# Patient Record
Sex: Male | Born: 1990 | Hispanic: No | Marital: Single | Smoking: Never smoker
Health system: Southern US, Community
[De-identification: ages and names within clinical notes are randomized; demographics above are authoritative.]

## PROBLEM LIST (undated history)

## (undated) DIAGNOSIS — G919 Hydrocephalus, unspecified: Secondary | ICD-10-CM

## (undated) HISTORY — PX: OTHER SURGICAL HISTORY: SHX169

---
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
## 2011-09-12 ENCOUNTER — Inpatient Hospital Stay (INDEPENDENT_AMBULATORY_CARE_PROVIDER_SITE_OTHER)
Admission: RE | Admit: 2011-09-12 | Discharge: 2011-09-12 | Disposition: A | Discharge status: Home / Self Care | Payer: Managed Care, Other (non HMO) | Source: Ambulatory Visit | Attending: Family Medicine | Admitting: Family Medicine

## 2011-09-12 DIAGNOSIS — L02619 Cutaneous abscess of unspecified foot: Secondary | ICD-10-CM

## 2015-12-05 ENCOUNTER — Encounter (HOSPITAL_COMMUNITY): Payer: Self-pay | Admitting: Emergency Medicine

## 2015-12-05 ENCOUNTER — Ambulatory Visit (INDEPENDENT_AMBULATORY_CARE_PROVIDER_SITE_OTHER): Payer: BLUE CROSS/BLUE SHIELD | Admitting: Family Medicine

## 2015-12-05 ENCOUNTER — Emergency Department (HOSPITAL_COMMUNITY)
Admission: EM | Admit: 2015-12-05 | Discharge: 2015-12-05 | Discharge status: Against Medical Advice | Payer: BLUE CROSS/BLUE SHIELD | Attending: Emergency Medicine | Admitting: Emergency Medicine

## 2015-12-05 VITALS — BP 110/62 | HR 63 | Temp 98.9°F | Resp 18 | Ht 68.5 in | Wt 141.2 lb

## 2015-12-05 DIAGNOSIS — R1084 Generalized abdominal pain: Secondary | ICD-10-CM | POA: Diagnosis present

## 2015-12-05 DIAGNOSIS — R1110 Vomiting, unspecified: Secondary | ICD-10-CM | POA: Insufficient documentation

## 2015-12-05 DIAGNOSIS — R1031 Right lower quadrant pain: Secondary | ICD-10-CM

## 2015-12-05 DIAGNOSIS — Z5321 Procedure and treatment not carried out due to patient leaving prior to being seen by health care provider: Secondary | ICD-10-CM

## 2015-12-05 HISTORY — DX: Hydrocephalus, unspecified: G91.9

## 2015-12-05 LAB — URINALYSIS, ROUTINE W REFLEX MICROSCOPIC
BILIRUBIN URINE: NEGATIVE
GLUCOSE, UA: NEGATIVE mg/dL
Hgb urine dipstick: NEGATIVE
KETONES UR: NEGATIVE mg/dL
LEUKOCYTES UA: NEGATIVE
Nitrite: NEGATIVE
PH: 7 (ref 5.0–8.0)
PROTEIN: NEGATIVE mg/dL
Specific Gravity, Urine: 1.025 (ref 1.005–1.030)

## 2015-12-05 LAB — COMPREHENSIVE METABOLIC PANEL
ALBUMIN: 3.8 g/dL (ref 3.5–5.0)
ALT: 19 U/L (ref 17–63)
AST: 28 U/L (ref 15–41)
Alkaline Phosphatase: 75 U/L (ref 38–126)
Anion gap: 11 (ref 5–15)
BILIRUBIN TOTAL: 0.5 mg/dL (ref 0.3–1.2)
BUN: 17 mg/dL (ref 6–20)
CHLORIDE: 102 mmol/L (ref 101–111)
CO2: 28 mmol/L (ref 22–32)
CREATININE: 0.83 mg/dL (ref 0.61–1.24)
Calcium: 10 mg/dL (ref 8.9–10.3)
GFR calc Af Amer: >60 mL/min (ref >60)
GLUCOSE: 116 mg/dL — AB (ref 65–99)
POTASSIUM: 4.3 mmol/L (ref 3.5–5.1)
Sodium: 141 mmol/L (ref 135–145)
TOTAL PROTEIN: 9.2 g/dL — AB (ref 6.5–8.1)

## 2015-12-05 LAB — CBC
HEMATOCRIT: 40.2 % (ref 39.0–52.0)
Hemoglobin: 13.6 g/dL (ref 13.0–17.0)
MCH: 30.6 pg (ref 26.0–34.0)
MCHC: 33.8 g/dL (ref 30.0–36.0)
MCV: 90.3 fL (ref 78.0–100.0)
PLATELETS: 290 10*3/uL (ref 150–400)
RBC: 4.45 MIL/uL (ref 4.22–5.81)
RDW: 11.8 % (ref 11.5–15.5)
WBC: 9.8 10*3/uL (ref 4.0–10.5)

## 2015-12-05 LAB — LIPASE, BLOOD: Lipase: 23 U/L (ref 11–51)

## 2015-12-05 NOTE — ED Notes (Signed)
Called pt to recheck V/S. Pt not found in lobby

## 2015-12-05 NOTE — Progress Notes (Signed)
[facility] [address] [place] [address] [phone]  Date:  12/05/2015   Name:  Phillip Mclaughlin   DOB:  06-20-1991   MRN:  [id number]  PCP:  No primary care provider on file.    Chief Complaint: Abdominal Pain and Shortness of Breath   History of Present Illness:  Phillip Mclaughlin is a 25 y.o. very pleasant male patient who presents with the following:  Here today as a new patient. Accompanied by his mother Misty Stanley who came up from near [place] to be with him.  Complaint of right lower belly pain for about one month, worse for 2 weeks He has a history of a VP shunt shunt.  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  Has not been able to run since he got sick   His NSG is Dr. Claiborne Billings at [facility]. Last saw him 2 weeks ago at which time he already had these symptoms.  He has the shunt due to hydrocephalus.  It has been in place since 2007, replaced last year.  It has been working well   He has had vomiting after eating.  He has not noted any fever.    Over the last month went to 2 different ERs near his home is [place] but we do not have these records.  Do not see anything on care everywhere.  He had labs and ?a CT but they are not sure what results may have been He saw a chiropractor this am- thought that perhaps his sx are due to MSK pain and applied physiotape to his abdomen   He has not been able to eat in a couple of days.  He is able to drink some fluids but not much.  Eating or drinking seems to cause a HA and vomiting.  No diarrhea.  He is having BMs.  Passing gas, burping or coughing causes belly pain.    There are no active problems to display for this patient.   History reviewed. No pertinent past medical history.  History reviewed. No pertinent past surgical history.  Social History  Substance Use Topics  . Smoking status: Never Smoker   . Smokeless tobacco: None  . Alcohol Use: No     History reviewed. No pertinent family history.  No Known Allergies  Medication list has been reviewed and updated.  No current outpatient prescriptions on file prior to visit.   No current facility-administered medications on file prior to visit.    Review of Systems:  As per HPI- otherwise negative. Shunt operations- no other surgeries  Physical Examination: Filed Vitals:   12/05/15 1415  BP: 110/62  Pulse: 63  Temp: 98.9 F (37.2 C)  Resp: 18   Filed Vitals:   12/05/15 1415  Height: 5' 8.5" (1.74 m)  Weight: 141 lb 3.2 oz (64.048 kg)   Body mass index is 21.15 kg/(m^2). Ideal Body Weight: Weight in (lb) to have BMI = 25: 166.5  GEN: WDWN, NAD, Non-toxic, A & O x 3, does not appear to feel well, laying down in room.  Moves slowly and painfully  HEENT: Atraumatic, Normocephalic. Neck supple. No masses, No LAD. Ears and Nose: No external deformity. CV: RRR, No M/G/R. No JVD. No thrill. No extra heart sounds. PULM: CTA B, no wheezes, crackles, rhonchi. No retractions. No resp. distress. No accessory muscle use. ABD: S, ND, +BS.No  HSM.  Diffuse tenderness and guarding of the abdomen. Most in the RUQ EXTR: No c/c/e NEURO Normal gait.  PSYCH: Normally interactive. Conversant. Not depressed or anxious appearing.  Calm demeanor.    Assessment and Plan: Right lower quadrant abdominal pain  Here today with abd pain that has been present for a month but worse for 1-2 weeks.  He has had some eval and imaging but we do not have these records.   It seems that his shunt is not to blame as pt reports this was evaluated and found to be working properly  Pt is frustrated that no one has been able to determine the cause of his illness.  He is frustrated when I recommended that he go to the ER- however explained that he needs imaging and labs that I do not have available here.  His mother will take him to the ER now. Will not charge him for today's visit  Signed Abbe Amsterdam, MD

## 2015-12-05 NOTE — ED Notes (Signed)
No answer from lobby  

## 2015-12-05 NOTE — Patient Instructions (Signed)
Please proceed to the ER at [facility] right away for further evaluation.  I hope that you will be feeling better soon- I'm sorry that I am not able to adequately take care of your here today but I think that you need imaging and labs not available at the clinic   Emergency Department - [facility] [address] [place], [place] [address]  If they are very busy you might try [facility] ER instead [facility] ? Address: [address] [place], [place] [address] Phone: [phone]

## 2015-12-05 NOTE — ED Notes (Signed)
Pt c/o severe generalized abdominal pain and vomiting x 4 weeks. Pt sts he has seen multiple doctors for the pain and no one has been able to tell him what is wrong. Pt sts the pain used to just come when he would exercise but now it's constant. Pt sts he is unable to eat and drink without vomiting. A&Ox4 and ambulatory.

## 2015-12-05 NOTE — ED Notes (Signed)
Called pt from lobby. Second attempt. No response.

## 2015-12-06 ENCOUNTER — Emergency Department (HOSPITAL_COMMUNITY): Payer: BLUE CROSS/BLUE SHIELD

## 2015-12-06 ENCOUNTER — Emergency Department (HOSPITAL_COMMUNITY)
Admission: EM | Admit: 2015-12-06 | Discharge: 2015-12-07 | Disposition: A | Discharge status: Other Acute Inpatient Hospital | Payer: BLUE CROSS/BLUE SHIELD | Attending: Emergency Medicine | Admitting: Emergency Medicine

## 2015-12-06 ENCOUNTER — Encounter (HOSPITAL_COMMUNITY): Payer: Self-pay

## 2015-12-06 DIAGNOSIS — G919 Hydrocephalus, unspecified: Secondary | ICD-10-CM | POA: Diagnosis not present

## 2015-12-06 DIAGNOSIS — R52 Pain, unspecified: Secondary | ICD-10-CM

## 2015-12-06 DIAGNOSIS — R109 Unspecified abdominal pain: Secondary | ICD-10-CM

## 2015-12-06 DIAGNOSIS — R1110 Vomiting, unspecified: Secondary | ICD-10-CM | POA: Diagnosis present

## 2015-12-06 LAB — COMPREHENSIVE METABOLIC PANEL
ALK PHOS: 73 U/L (ref 38–126)
ALT: 20 U/L (ref 17–63)
AST: 27 U/L (ref 15–41)
Albumin: 3.7 g/dL (ref 3.5–5.0)
Anion gap: 12 (ref 5–15)
BUN: 15 mg/dL (ref 6–20)
CALCIUM: 9.7 mg/dL (ref 8.9–10.3)
CO2: 27 mmol/L (ref 22–32)
CREATININE: 0.94 mg/dL (ref 0.61–1.24)
Chloride: 101 mmol/L (ref 101–111)
Glucose, Bld: 100 mg/dL — ABNORMAL HIGH (ref 65–99)
Potassium: 3.9 mmol/L (ref 3.5–5.1)
SODIUM: 140 mmol/L (ref 135–145)
TOTAL PROTEIN: 8.8 g/dL — AB (ref 6.5–8.1)
Total Bilirubin: 0.5 mg/dL (ref 0.3–1.2)

## 2015-12-06 LAB — CBC
HCT: 39 % (ref 39.0–52.0)
Hemoglobin: 13 g/dL (ref 13.0–17.0)
MCH: 30.1 pg (ref 26.0–34.0)
MCHC: 33.3 g/dL (ref 30.0–36.0)
MCV: 90.3 fL (ref 78.0–100.0)
PLATELETS: 269 10*3/uL (ref 150–400)
RBC: 4.32 MIL/uL (ref 4.22–5.81)
RDW: 11.8 % (ref 11.5–15.5)
WBC: 11 10*3/uL — AB (ref 4.0–10.5)

## 2015-12-06 LAB — RAPID URINE DRUG SCREEN, HOSP PERFORMED
AMPHETAMINES: NOT DETECTED
BENZODIAZEPINES: NOT DETECTED
Barbiturates: NOT DETECTED
Cocaine: NOT DETECTED
Opiates: NOT DETECTED
Tetrahydrocannabinol: NOT DETECTED

## 2015-12-06 LAB — URINALYSIS, ROUTINE W REFLEX MICROSCOPIC
Bilirubin Urine: NEGATIVE
Glucose, UA: NEGATIVE mg/dL
HGB URINE DIPSTICK: NEGATIVE
Ketones, ur: NEGATIVE mg/dL
LEUKOCYTES UA: NEGATIVE
Nitrite: NEGATIVE
PROTEIN: NEGATIVE mg/dL
Specific Gravity, Urine: 1.015 (ref 1.005–1.030)
pH: 6 (ref 5.0–8.0)

## 2015-12-06 LAB — LIPASE, BLOOD: Lipase: 24 U/L (ref 11–51)

## 2015-12-06 MED ORDER — ONDANSETRON HCL 4 MG/2ML IJ SOLN
4.0000 mg | Freq: Once | INTRAMUSCULAR | Status: AC
  Administered 2015-12-06: 4 mg via INTRAVENOUS
  Filled 2015-12-06: qty 2

## 2015-12-06 MED ORDER — SODIUM CHLORIDE 0.9 % IV BOLUS (SEPSIS)
1000.0000 mL | Freq: Once | INTRAVENOUS | Status: AC
  Administered 2015-12-06: 1000 mL via INTRAVENOUS

## 2015-12-06 MED ORDER — FENTANYL CITRATE (PF) 100 MCG/2ML IJ SOLN
50.0000 ug | Freq: Once | INTRAMUSCULAR | Status: AC
  Administered 2015-12-06: 50 ug via INTRAVENOUS
  Filled 2015-12-06: qty 2

## 2015-12-06 MED ORDER — IOHEXOL 300 MG/ML  SOLN
100.0000 mL | Freq: Once | INTRAMUSCULAR | Status: AC | PRN
  Administered 2015-12-06: 100 mL via INTRAVENOUS

## 2015-12-06 MED ORDER — FENTANYL CITRATE (PF) 100 MCG/2ML IJ SOLN: 50.0000 ug | INTRAMUSCULAR | Status: DC | PRN

## 2015-12-06 MED ORDER — PROCHLORPERAZINE EDISYLATE 5 MG/ML IJ SOLN
10.0000 mg | Freq: Four times a day (QID) | INTRAMUSCULAR | Status: DC | PRN
  Administered 2015-12-06: 10 mg via INTRAVENOUS
  Filled 2015-12-06: qty 2

## 2015-12-06 NOTE — ED Provider Notes (Signed)
CSN: [id number]     Arrival date & time 12/06/15  1228 History   First MD Initiated Contact with Patient 12/06/15 1648     Chief Complaint  Patient presents with  . Abdominal Pain  . Emesis     (Consider location/radiation/quality/duration/timing/severity/associated sxs/prior Treatment) HPI Phillip Mclaughlin is a 25 y.o. male history of hydrocephalus with shunt in place, presents to emergency department complaining of abdominal pain. Patient states he developed mild abdominal pain about one month ago. He states he first noticed the pain while exercising and running. He states he thought he pulled a muscle, and has been seeing chiropractor, and states it may have helped some initially. He states however the last 2 weeks pain has worsened in severity. It is now constant. It is worse with movement and eating. Patient states he is unable to keep anything down. He states he also gets severe headaches but only after eating food. He has been taking ibuprofen and Aleve with no relief. Patient denies any diarrhea, states that his last normal bowel movement today. Patient states he was in his symptoms began he was seen in emergency department in [facility]. There he had "some type of imaging test done." He states they told him it showed fluid around the shunt in his abdomen. He then followed up with his neurosurgeon who did some shunt serious and he told patient that everything looked normal. Patient then was seen by gastroenterologist, who did not do any tests, but was going to review the CT scan and was going to get back with the patient. Patient again was seen by a primary care doctor yesterday here in [place], who told him to come to emergency department for further imaging and labs. Patient denies any changes in vision, no dizziness, no neck pain, no chest pain. No urinary symptoms. Denies any drugs or alcohol. He has no other complaints. He states he was taking some over-the-counter multivitamin but  stopped.   Past Medical History  Diagnosis Date  . Hydrocephalus 2004   Past Surgical History  Procedure Laterality Date  . Hydrocephalus surgery     History reviewed. No pertinent family history. Social History  Substance Use Topics  . Smoking status: Never Smoker   . Smokeless tobacco: None  . Alcohol Use: No    Review of Systems  Constitutional: Negative for fever and chills.  Respiratory: Negative for cough, chest tightness and shortness of breath.   Cardiovascular: Negative for chest pain, palpitations and leg swelling.  Gastrointestinal: Positive for nausea, vomiting and abdominal pain. Negative for diarrhea, constipation, blood in stool, abdominal distention and anal bleeding.  Genitourinary: Negative for dysuria, urgency, frequency and hematuria.  Musculoskeletal: Negative for myalgias, arthralgias, neck pain and neck stiffness.  Skin: Negative for rash.  Allergic/Immunologic: Negative for immunocompromised state.  Neurological: Positive for headaches. Negative for dizziness, weakness, light-headedness and numbness.  All other systems reviewed and are negative.     Allergies  Review of patient's allergies indicates no known allergies.  Home Medications   Prior to Admission medications   Medication Sig Start Date End Date Taking? Authorizing Provider  aspirin-acetaminophen-caffeine (EXCEDRIN MIGRAINE) [phone] MG tablet Take 2 tablets by mouth every 6 (six) hours as needed for headache.   Yes Historical Provider, MD  naproxen sodium (ANAPROX) 220 MG tablet Take 220 mg by mouth 2 (two) times daily as needed (headache).   Yes Historical Provider, MD   BP 122/84 mmHg  Pulse 86  Temp(Src) 97.7 F (36.5 C) (Oral)  Resp 16  SpO2 100% Physical Exam  Constitutional: He appears well-developed and well-nourished. No distress.  HENT:  Head: Normocephalic and atraumatic.  Eyes: Conjunctivae are normal.  Neck: Neck supple.  Cardiovascular: Normal rate, regular  rhythm and normal heart sounds.   Pulmonary/Chest: Effort normal. No respiratory distress. He has no wheezes. He has no rales.  Abdominal: Soft. Bowel sounds are normal. He exhibits no distension. There is tenderness. There is no rebound.  Diffuse tenderness, with worse tenderness in RUQ and RLQ. Negative psoas and obturator signs. Some guarding present.   Musculoskeletal: He exhibits no edema.  Neurological: He is alert.  5/5 and equal upper and lower extremity strength bilaterally. Equal grip strength bilaterally. Normal finger to nose and heel to shin. No pronator drift.   Skin: Skin is warm and dry.  Nursing note and vitals reviewed.   ED Course  Procedures (including critical care time) Labs Review Labs Reviewed  COMPREHENSIVE METABOLIC PANEL - Abnormal; Notable for the following:    Glucose, Bld 100 (*)    Total Protein 8.8 (*)    All other components within normal limits  CBC - Abnormal; Notable for the following:    WBC 11.0 (*)    All other components within normal limits  LIPASE, BLOOD  URINALYSIS, ROUTINE W REFLEX MICROSCOPIC (NOT AT [facility])  URINE RAPID DRUG SCREEN, HOSP PERFORMED    Imaging Review Dg Skull 1-3 Views  12/06/2015  CLINICAL DATA:  Hydrocephalus, shunt EXAM: SKULL - 1-3 VIEW COMPARISON:  None FINDINGS: Intracranial shunt via RIGHT frontal approach with tip at the midline. No shunt discontinuity identified. A segment of the shunt at the lateral aspect of the RIGHT calvarium is radiolucent and integrity of the segment cannot be assessed. Osseous structures unremarkable. IMPRESSION: No definite shunt abnormalities identified. Electronically Signed   By: Ulyses Southward M.D.   On: 12/06/2015 19:40   Dg Chest 1 View  12/06/2015  CLINICAL DATA:  25 year old male with hydrocephalus and headache. Evaluation VP shunt. EXAM: CHEST 1 VIEW COMPARISON:  None. FINDINGS: The cardiomediastinal silhouette is unremarkable. There is no evidence of focal airspace disease, pulmonary  edema, suspicious pulmonary nodule/mass, pleural effusion, or pneumothorax. No acute bony abnormalities are identified. The visualized portions of the right-sided ventriculoperitoneal shunt catheter are unremarkable. IMPRESSION: No active disease. Visualized portions of the VP shunt are unremarkable. Electronically Signed   By: Harmon Pier M.D.   On: 12/06/2015 19:39   Dg Cervical Spine 2-3 Views  12/06/2015  CLINICAL DATA:  Part of a shunt series to evaluate hydrocephalus, headache for 1 day, abdominal pain vomiting and chills for 4 weeks EXAM: CERVICAL SPINE - 2-3 VIEW COMPARISON:  None. FINDINGS: Shunt tubing in the right cervical soft tissues intact. No acute soft tissue abnormalities. IMPRESSION: Single AP view cervical spine demonstrates shunt tubing Electronically Signed   By: Esperanza Heir M.D.   On: 12/06/2015 19:41   Dg Abd 1 View  12/06/2015  CLINICAL DATA:  Headache and hydrocephalus.  Evaluate VP shunt. EXAM: ABDOMEN - 1 VIEW COMPARISON:  None. FINDINGS: The visualized portions of the ventriculoperitoneal shunt are unremarkable. The bowel gas pattern is unremarkable. No dilated bowel loops are present. No bony abnormalities are present. IMPRESSION: Visualized portions of VP shunt catheter are unremarkable. Unremarkable bowel gas pattern. Electronically Signed   By: Harmon Pier M.D.   On: 12/06/2015 19:40   Ct Head Wo Contrast  12/06/2015  CLINICAL DATA:  25 year old male with vomiting and chills for 4 days. History  of shunt placement for hydrocephalus. EXAM: CT HEAD WITHOUT CONTRAST TECHNIQUE: Contiguous axial images were obtained from the base of the skull through the vertex without intravenous contrast. COMPARISON:  None. FINDINGS: Moderate to severe ventriculomegaly/hydrocephalus is identified. A right frontal ventriculostomy catheter is identified at the midline in the frontal interventricular region. Mild edema/hypodensity along the ventriculostomy catheter tract is noted. There is no  evidence of acute infarction, hemorrhage, mass lesion or midline shift. No acute bony abnormalities are identified. IMPRESSION: Moderate to severe ventriculomegaly/hydrocephalus. Correlate with any prior outside studies to assess interval change. Right frontal ventriculostomy catheter as described. Electronically Signed   By: Harmon Pier M.D.   On: 12/06/2015 18:47   Ct Abdomen Pelvis W Contrast  12/06/2015  CLINICAL DATA:  Abdominal pain for 4 weeks, vomiting, and chills for 4 days, prior shunt placement for hydrocephalus EXAM: CT ABDOMEN AND PELVIS WITH CONTRAST TECHNIQUE: Multidetector CT imaging of the abdomen and pelvis was performed using the standard protocol following bolus administration of intravenous contrast. Sagittal and coronal MPR images reconstructed from axial data set. CONTRAST:  OMNIPAQUE IOHEXOL 300 MG/ML SOLN IV. Dilute oral contrast. COMPARISON:  None FINDINGS: Lung bases clear. Contracted gallbladder. Liver, spleen, pancreas, kidneys, and adrenal glands normal. Two adjacent well-defined loculated fluid collections in the upper abdomen centered at the gastrohepatic ligament and inferior margin of the LEFT lobe of the liver, larger collection 5.6 x 3.6 x 4.3 cm and smaller collection 3.5 x 1.3 x 2.2 cm. VP shunt tubing traverses the RIGHT hemi thorax and is coiled in the RIGHT abdomen with the tip in the upper mid abdomen immediately inferior to the fluid collections. Mild mass effect upon the RIGHT lateral aspect of the stomach as well as mass effect upon the LEFT lobe of the liver. 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 No changes of acute pancreatitis are visualized, though fluid is seen within lesser sac. Large and small bowel loops grossly normal appearance. No significant additional free intraperitoneal fluid seen. Normal appearing bladder and ureters. No adenopathy, free air, or hernia. Bones unremarkable. IMPRESSION: Two adjacent low-attenuation fluid collections are  identified in the upper abdomen located at the gastrohepatic ligament and inferior margin LEFT lobe liver, which additional fluid seen extending into the lesser sac. Fluid collections are near the tip of the VP shunt and exert mass effect upon stomach and liver. 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 No definite adjacent changes of pancreatitis are identified to account for these findings but recommend correlation with serum amylase to confirm absence of pancreatitis as cause. Electronically Signed   By: Ulyses Southward M.D.   On: 12/06/2015 18:57   I have personally reviewed and evaluated these images and lab results as part of my medical decision-making.   EKG Interpretation None      MDM   Final diagnoses:  Hydrocephalus  Abdominal pain, unspecified abdominal location    Patient with history of hydrocephalus with VP shunt. Pt with worsening headache and abdominal pain. Multiple work up for this in the last 4 wks, including two trips to ED at home, his neurosurgeon, gastroenterologist. Will try pain medications, will get labs, CT of the head and abdomen and pelvis ordered.  Patient CT of the head showed moderate to severe ventriculomegaly/hydrocephalus. 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 PT's pain not improved with pain medications however, he is not wanting any more medications. He  is neurovascularly intact. VS normal. Discussed with Dr. Jordan Likes, who advised pt will need admission and possible shunt revision. I spoke with family who chose to stay with neurosurgeon who is familiar with patient and where they have done all of their testing and [facility]. I got in touch with Dr. Ann Maki and a PA with [facility] neururgery and and spine and [facility]. They will admit and accept patient for transfer.  Apparently there is a shortage of beds at [facility]. Patient will be monitored here closely  until bed is available. He will then be transferred there by transfer service. Family is aware.  11:43 PM Spoke with Dr. Ann Maki again with [facility] neurosurgery. He reviewed CTs which we were able to transfer to their pacs. He is concerned about worsening of pt's imaging. Asked for pt to be transferred to ED for him to evaluate him while bed is pending. Will transfer there.   Filed Vitals:   12/06/15 2130 12/06/15 2148 12/06/15 2200 12/06/15 2300  BP: 113/75 113/75 115/70 122/59  Pulse: 66 50 52 59  Temp:      TempSrc:      Resp:  14    SpO2: 98% 97% 97% 97%      Jaynie Crumble, PA-C 12/07/15 0051  Melene Plan, DO 12/07/15 1646

## 2015-12-06 NOTE — ED Notes (Signed)
Patient states he has had abdominal pain x 4 weeks and vomiting and chills x 4 days. Patient denies diarrhea.

## 2015-12-06 NOTE — ED Notes (Signed)
Bed: WA01 Expected date:  Expected time:  Means of arrival:  Comments: 

## 2015-12-06 NOTE — ED Notes (Signed)
Offered pt pain medication at this time, he does not want any pain meds. Updated on plan at this time

## 2015-12-06 NOTE — ED Notes (Signed)
Pts family given sandwiches and sodas.

## 2015-12-06 NOTE — Progress Notes (Signed)
Patient listed as having [employer] insurance without a pcp.  EDCM spoke to patient's mother at bedside as patient was asleep.  Patient's mother reports patient's pcp is Dr. Beola Cord on [address] in [place].  [facility] unable to locate this provider.  Patient's mother reports she has made an appointment for a new pcp here ion [place] while he goes to school with Dr. Hillard Danker in [place] on Feb 24th.  No further EDCM needs at this time.

## 2015-12-06 NOTE — ED Notes (Signed)
[phone] pt mother LISA, please called with any updates or bed placement.

## 2015-12-07 DIAGNOSIS — R1110 Vomiting, unspecified: Secondary | ICD-10-CM | POA: Diagnosis present

## 2015-12-07 DIAGNOSIS — G919 Hydrocephalus, unspecified: Secondary | ICD-10-CM | POA: Diagnosis not present

## 2015-12-19 NOTE — ED Provider Notes (Signed)
Left without being seen.   1. Patient left without being seen      Melene Plan, DO 12/19/15 [phone]

## 2016-01-06 ENCOUNTER — Ambulatory Visit: Payer: Managed Care, Other (non HMO) | Admitting: Internal Medicine

## 2016-01-10 ENCOUNTER — Telehealth: Payer: Self-pay | Admitting: Family Medicine

## 2016-01-10 NOTE — Telephone Encounter (Signed)
Molly with [facility] dropped off records for this patient. This is a Dietitian that will be seeing you. I am sending records back for you to review.

## 2017-01-22 ENCOUNTER — Emergency Department (HOSPITAL_COMMUNITY): Payer: 59

## 2017-01-22 ENCOUNTER — Emergency Department (HOSPITAL_COMMUNITY)
Admission: EM | Admit: 2017-01-22 | Discharge: 2017-01-22 | Disposition: A | Discharge status: Home / Self Care | Payer: 59 | Attending: Emergency Medicine | Admitting: Emergency Medicine

## 2017-01-22 ENCOUNTER — Encounter (HOSPITAL_COMMUNITY): Payer: Self-pay | Admitting: Emergency Medicine

## 2017-01-22 DIAGNOSIS — Y929 Unspecified place or not applicable: Secondary | ICD-10-CM | POA: Diagnosis not present

## 2017-01-22 DIAGNOSIS — Y9302 Activity, running: Secondary | ICD-10-CM | POA: Diagnosis not present

## 2017-01-22 DIAGNOSIS — S0990XA Unspecified injury of head, initial encounter: Secondary | ICD-10-CM | POA: Insufficient documentation

## 2017-01-22 DIAGNOSIS — S0181XA Laceration without foreign body of other part of head, initial encounter: Secondary | ICD-10-CM

## 2017-01-22 DIAGNOSIS — W000XXA Fall on same level due to ice and snow, initial encounter: Secondary | ICD-10-CM | POA: Insufficient documentation

## 2017-01-22 DIAGNOSIS — S01112A Laceration without foreign body of left eyelid and periocular area, initial encounter: Secondary | ICD-10-CM | POA: Diagnosis present

## 2017-01-22 DIAGNOSIS — S80212A Abrasion, left knee, initial encounter: Secondary | ICD-10-CM

## 2017-01-22 DIAGNOSIS — Y999 Unspecified external cause status: Secondary | ICD-10-CM | POA: Insufficient documentation

## 2017-01-22 MED ORDER — ACETAMINOPHEN 325 MG PO TABS
650.0000 mg | ORAL_TABLET | Freq: Once | ORAL | Status: AC
  Administered 2017-01-22: 650 mg via ORAL
  Filled 2017-01-22: qty 2

## 2017-01-22 MED ORDER — BACITRACIN ZINC 500 UNIT/GM EX OINT
1.0000 "application " | TOPICAL_OINTMENT | Freq: Once | CUTANEOUS | Status: AC
  Administered 2017-01-22: 1 via TOPICAL
  Filled 2017-01-22: qty 0.9

## 2017-01-22 MED ORDER — LIDOCAINE HCL (PF) 1 % IJ SOLN
5.0000 mL | Freq: Once | INTRAMUSCULAR | Status: AC
  Administered 2017-01-22: 5 mL via INTRADERMAL
  Filled 2017-01-22: qty 30

## 2017-01-22 MED ORDER — LIDOCAINE HCL (PF) 1 % IJ SOLN: 10.0000 mL | Freq: Once | INTRAMUSCULAR | Status: DC

## 2017-01-22 MED ORDER — SODIUM BICARBONATE 4 % IV SOLN
5.0000 mL | Freq: Once | INTRAVENOUS | Status: AC
  Administered 2017-01-22: 5 mL via SUBCUTANEOUS
  Filled 2017-01-22: qty 5

## 2017-01-22 NOTE — ED Provider Notes (Signed)
[facility]-EMERGENCY DEPT Provider Note   CSN: [id number] Arrival date & time: 01/22/17  [id number]     History   Chief Complaint Chief Complaint  Patient presents with  . Fall  . Head Injury    HPI Phillip Mclaughlin is a 26 y.o. male.  HPI  Patient presents to the emergency room for evaluation of injuries after a fall. Patient went out for a run this morning. He slipped on the ice and fell. Patient landed striking the left side of his head and his left knee. Patient was able to get up and walk after the fall. He denies any loss of consciousness but states he has not quite been feeling well since that fall. Patient denies any vomiting. He denies any numbness or weakness. He complains of a laceration of his left eyebrow and an abrasion on his left knee. He does have pain in his face. Denies any neck pain. No numbness or weakness. Patient does have a history of hydrocephalus and VP shunt. Patient states he shunts on the right side and does not think that he fell onto it. Past Medical History:  Diagnosis Date  . Hydrocephalus 2004     There are no active problems to display for this patient.   Past Surgical History:  Procedure Laterality Date  . hydrocephalus surgery         Home Medications    Prior to Admission medications   Medication Sig Start Date End Date Taking? Authorizing Provider  sulfamethoxazole-trimethoprim (BACTRIM DS,SEPTRA DS) 800-160 MG tablet Take 1 tablet by mouth 2 (two) times daily. 01/18/17 01/23/17 Yes Historical Provider, MD    Family History No family history on file.  Social History Social History  Substance Use Topics  . Smoking status: Never Smoker  . Smokeless tobacco: Not on file  . Alcohol use No     Allergies   Patient has no known allergies.   Review of Systems Review of Systems  All other systems reviewed and are negative.    Physical Exam Updated Vital Signs BP 128/86 (BP Location: Left Arm)   Pulse (!) 58   Resp 17   Ht 5\' 7"  (1.702  m)   Wt 63.5 kg   SpO2 100%   BMI 21.93 kg/m   Physical Exam  Constitutional: He appears well-developed and well-nourished. No distress.  HENT:  Head: Normocephalic.  Right Ear: External ear normal.  Left Ear: External ear normal.  Abrasion to the left face, ttp left periorbital region and nares, laceration to lateral aspect of left eyebrow   Eyes: Conjunctivae are normal. Right eye exhibits no discharge. Left eye exhibits no discharge. No scleral icterus.  Neck: Neck supple. No tracheal deviation present.  Cardiovascular: Normal rate, regular rhythm and intact distal pulses.   Pulmonary/Chest: Effort normal and breath sounds normal. No stridor. No respiratory distress. He has no wheezes. He has no rales.  Abdominal: Soft. Bowel sounds are normal. He exhibits no distension. There is no tenderness. There is no rebound and no guarding.  Musculoskeletal: He exhibits tenderness. He exhibits no edema.       Left knee: He exhibits no swelling and no deformity.  Abrasion left knee  Neurological: He is alert. He has normal strength. No cranial nerve deficit (no facial droop, extraocular movements intact, no slurred speech) or sensory deficit. He exhibits normal muscle tone. He displays no seizure activity. Coordination normal.  Skin: Skin is warm and dry. No rash noted.  Psychiatric: He has a normal mood and  affect.  Nursing note and vitals reviewed.    ED Treatments / Results    Radiology Ct Head Wo Contrast  Result Date: 01/22/2017 CLINICAL DATA:  Larey Seat while running today. Hit left side of head. Denies loss of consciousness. EXAM: CT HEAD WITHOUT CONTRAST CT MAXILLOFACIAL WITHOUT CONTRAST CT CERVICAL SPINE WITHOUT CONTRAST TECHNIQUE: Multidetector CT imaging of the head, cervical spine, and maxillofacial structures were performed using the standard protocol without intravenous contrast. Multiplanar CT image reconstructions of the cervical spine and maxillofacial structures were also  generated. COMPARISON:  CT head 12/06/2015 FINDINGS: CT HEAD FINDINGS Brain: A right frontal ventriculostomy catheter appears in stable position compared to the prior head CT 12/06/2015. The distal tip projects near the midline near the interventricular septum of the posterior frontal ventricles. There is moderate hydrocephalus. The degree of hydrocephalus has significantly decreased since the head CT of 12/06/2015. Third ventricle now measures 1.3 cm transverse diameter at its maximum, and previously measured up to 1.8 cm maximum diameter on 12/06/2015. Negative for intraparenchymal or intraventricular hemorrhage. No evidence of acute infarction. Negative for mass lesion. Shunt tubing courses along the scalp right scalp laterally and posteriorly. On the scout view, the shunt tubing appears grossly intact. Vascular: No hyperdense vessel or unexpected calcification. Skull: The skull is intact. The visualized paranasal sinuses and mastoid air cells are clear. Other: No scalp hematoma, debris, or soft tissue gas is appreciated. CT MAXILLOFACIAL FINDINGS Osseous: No fracture or mandibular dislocation. No destructive process. Orbits: Negative. No traumatic or inflammatory finding. Sinuses: Clear. Soft tissues: Negative. CT CERVICAL SPINE FINDINGS Alignment: Normal. Skull base and vertebrae: No acute fracture. No primary bone lesion or focal pathologic process. Soft tissues and spinal canal: No acute fracture. No primary bone lesion or focal pathologic process. Disc levels: Negative for a disc space narrowing. The neural foramina are are widely patent at all levels. Upper chest: The lung apices are well aerated. No evidence of pneumothorax. Other: None IMPRESSION: 1. No acute intracranial abnormality. Ventriculostomy catheter terminates in the midline near the foramen of Monro. Moderate hydrocephalus is present. Hydrocephalus is decreased/less severe now as compared to the head CT 12/06/2015. 2. Negative for acute bony  injury or significant soft tissue injury to the face. 3. Negative for acute cervical spine fracture. No significant degenerative change of the cervical spine. Electronically Signed   By: Britta Mccreedy M.D.   On: 01/22/2017 10:18   Ct Cervical Spine Wo Contrast  Result Date: 01/22/2017 CLINICAL DATA:  Larey Seat while running today. Hit left side of head. Denies loss of consciousness. EXAM: CT HEAD WITHOUT CONTRAST CT MAXILLOFACIAL WITHOUT CONTRAST CT CERVICAL SPINE WITHOUT CONTRAST TECHNIQUE: Multidetector CT imaging of the head, cervical spine, and maxillofacial structures were performed using the standard protocol without intravenous contrast. Multiplanar CT image reconstructions of the cervical spine and maxillofacial structures were also generated. COMPARISON:  CT head 12/06/2015 FINDINGS: CT HEAD FINDINGS Brain: A right frontal ventriculostomy catheter appears in stable position compared to the prior head CT 12/06/2015. The distal tip projects near the midline near the interventricular septum of the posterior frontal ventricles. There is moderate hydrocephalus. The degree of hydrocephalus has significantly decreased since the head CT of 12/06/2015. Third ventricle now measures 1.3 cm transverse diameter at its maximum, and previously measured up to 1.8 cm maximum diameter on 12/06/2015. Negative for intraparenchymal or intraventricular hemorrhage. No evidence of acute infarction. Negative for mass lesion. Shunt tubing courses along the scalp right scalp laterally and posteriorly. On the scout view, the  shunt tubing appears grossly intact. Vascular: No hyperdense vessel or unexpected calcification. Skull: The skull is intact. The visualized paranasal sinuses and mastoid air cells are clear. Other: No scalp hematoma, debris, or soft tissue gas is appreciated. CT MAXILLOFACIAL FINDINGS Osseous: No fracture or mandibular dislocation. No destructive process. Orbits: Negative. No traumatic or inflammatory finding.  Sinuses: Clear. Soft tissues: Negative. CT CERVICAL SPINE FINDINGS Alignment: Normal. Skull base and vertebrae: No acute fracture. No primary bone lesion or focal pathologic process. Soft tissues and spinal canal: No acute fracture. No primary bone lesion or focal pathologic process. Disc levels: Negative for a disc space narrowing. The neural foramina are are widely patent at all levels. Upper chest: The lung apices are well aerated. No evidence of pneumothorax. Other: None IMPRESSION: 1. No acute intracranial abnormality. Ventriculostomy catheter terminates in the midline near the foramen of Monro. Moderate hydrocephalus is present. Hydrocephalus is decreased/less severe now as compared to the head CT 12/06/2015. 2. Negative for acute bony injury or significant soft tissue injury to the face. 3. Negative for acute cervical spine fracture. No significant degenerative change of the cervical spine. Electronically Signed   By: Britta Mccreedy M.D.   On: 01/22/2017 10:18   Dg Knee Complete 4 Views Left  Result Date: 01/22/2017 CLINICAL DATA:  Slipped on icy surface this morning following on the left knee. EXAM: LEFT KNEE - COMPLETE 4+ VIEW COMPARISON:  None in PACs FINDINGS: The bones are subjectively adequately mineralized. There is no acute fracture nor dislocation. There is no joint effusion. There is beaking of the tibial spines. There are spurs arising from the superior and inferior articular margins of the patella. There is a radiodensity in the soft tissues overlying the anterior aspect of the fibular neck which may lie in bandage material. IMPRESSION: No acute fracture nor dislocation of the knee is observed. 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 Electronically Signed   By: David  [place] M.D.   On: 01/22/2017 09:43   Ct Maxillofacial Wo Cm  Result Date: 01/22/2017 CLINICAL DATA:  Larey Seat while running today. Hit left side of  head. Denies loss of consciousness. EXAM: CT HEAD WITHOUT CONTRAST CT MAXILLOFACIAL WITHOUT CONTRAST CT CERVICAL SPINE WITHOUT CONTRAST TECHNIQUE: Multidetector CT imaging of the head, cervical spine, and maxillofacial structures were performed using the standard protocol without intravenous contrast. Multiplanar CT image reconstructions of the cervical spine and maxillofacial structures were also generated. COMPARISON:  CT head 12/06/2015 FINDINGS: CT HEAD FINDINGS Brain: A right frontal ventriculostomy catheter appears in stable position compared to the prior head CT 12/06/2015. The distal tip projects near the midline near the interventricular septum of the posterior frontal ventricles. There is moderate hydrocephalus. The degree of hydrocephalus has significantly decreased since the head CT of 12/06/2015. Third ventricle now measures 1.3 cm transverse diameter at its maximum, and previously measured up to 1.8 cm maximum diameter on 12/06/2015. Negative for intraparenchymal or intraventricular hemorrhage. No evidence of acute infarction. Negative for mass lesion. Shunt tubing courses along the scalp right scalp laterally and posteriorly. On the scout view, the shunt tubing appears grossly intact. Vascular: No hyperdense vessel or unexpected calcification. Skull: The skull is intact. The visualized paranasal sinuses and mastoid air cells are clear. Other: No scalp hematoma, debris, or soft tissue gas is appreciated. CT MAXILLOFACIAL FINDINGS Osseous: No fracture or mandibular dislocation. No destructive process. Orbits: Negative. No traumatic or inflammatory finding. Sinuses: Clear. Soft tissues:  Negative. CT CERVICAL SPINE FINDINGS Alignment: Normal. Skull base and vertebrae: No acute fracture. No primary bone lesion or focal pathologic process. Soft tissues and spinal canal: No acute fracture. No primary bone lesion or focal pathologic process. Disc levels: Negative for a disc space narrowing. The neural  foramina are are widely patent at all levels. Upper chest: The lung apices are well aerated. No evidence of pneumothorax. Other: None IMPRESSION: 1. No acute intracranial abnormality. Ventriculostomy catheter terminates in the midline near the foramen of Monro. Moderate hydrocephalus is present. Hydrocephalus is decreased/less severe now as compared to the head CT 12/06/2015. 2. Negative for acute bony injury or significant soft tissue injury to the face. 3. Negative for acute cervical spine fracture. No significant degenerative change of the cervical spine. Electronically Signed   By: Britta Mccreedy M.D.   On: 01/22/2017 10:18    Procedures Procedures (including critical care time)  Medications Ordered in ED Medications  lidocaine (PF) (XYLOCAINE) 1 % injection 5 mL (not administered)  bacitracin ointment 1 application (not administered)  lidocaine (PF) (XYLOCAINE) 1 % injection 10 mL (not administered)  sodium bicarbonate (NEUT) 4 % injection 5 mL (not administered)  acetaminophen (TYLENOL) tablet 650 mg (650 mg Oral Given 01/22/17 0959)     Initial Impression / Assessment and Plan / ED Course  I have reviewed the triage vital signs and the nursing notes.  Pertinent labs & imaging results that were available during my care of the patient were reviewed by me and considered in my medical decision making (see chart for details).   x-ray imaging is reassuring. No evidence of significant injury.  Knee abrasion was irrigated. Plan on bacitracin and sterile dressing. We will plan on suture repair of the left eyebrow laceration.  PA Dahlia Client will perform suture repair  Final Clinical Impressions(s) / ED Diagnoses   Final diagnoses:  Facial laceration, initial encounter  Abrasion of left knee, initial encounter    New Prescriptions New Prescriptions   No medications on file     Linwood Dibbles, MD 01/22/17 1126

## 2017-01-22 NOTE — ED Triage Notes (Signed)
Patient was doing morning run when hit slick spot and fell. Patient hit left side of head but denies any LOC or taking blood thinners. Patient has abraisons to face and left leg. Patient has shunt on right side of head.

## 2017-01-22 NOTE — ED Notes (Signed)
Patient transported to CT 

## 2017-01-22 NOTE — Discharge Instructions (Signed)
Absorbable sutures were placed, apply antibiotic ointment to the wound, he can take Tylenol or ibuprofen as needed for aches and pains. Apply ice to help with the swelling..  Monitor for worsening headache, confusion, vomiting

## 2017-01-22 NOTE — ED Provider Notes (Signed)
..  Laceration Repair Date/Time: 01/22/2017 12:14 PM Performed by: Roxy HorsemanBROWNING, Phillip Menning Authorized by: Roxy HorsemanBROWNING, Phillip Mclaughlin   Consent:    Consent obtained:  Verbal   Consent given by:  Patient   Risks discussed:  Infection, pain and poor cosmetic result Anesthesia (see MAR for exact dosages):    Anesthesia method:  Local infiltration   Local anesthetic:  Lidocaine 1% w/o epi and sodium bicarbonate Laceration details:    Location:  Face   Face location:  L eyebrow   Length (cm):  2 Repair type:    Repair type:  Simple Pre-procedure details:    Preparation:  Patient was prepped and draped in usual sterile fashion Exploration:    Hemostasis achieved with:  Direct pressure   Wound exploration: entire depth of wound probed and visualized     Contaminated: no   Treatment:    Area cleansed with:  Betadine and saline   Amount of cleaning:  Standard   Irrigation solution:  Sterile saline Skin repair:    Repair method:  Sutures   Suture size:  5-0   Wound skin closure material used: Vicryl rapide.   Number of sutures:  3 Approximation:    Approximation:  Close   Vermilion border: well-aligned   Post-procedure details:    Patient tolerance of procedure:  Tolerated well, no immediate complications      Roxy HorsemanRobert Harnoor Reta, PA-C 01/22/17 1215

## 2017-01-31 ENCOUNTER — Encounter (HOSPITAL_COMMUNITY): Payer: Self-pay | Admitting: Emergency Medicine

## 2017-01-31 ENCOUNTER — Emergency Department (HOSPITAL_COMMUNITY)
Admission: EM | Admit: 2017-01-31 | Discharge: 2017-01-31 | Disposition: A | Discharge status: Home / Self Care | Payer: BLUE CROSS/BLUE SHIELD | Attending: Emergency Medicine | Admitting: Emergency Medicine

## 2017-01-31 DIAGNOSIS — Z4802 Encounter for removal of sutures: Secondary | ICD-10-CM | POA: Diagnosis present

## 2017-01-31 DIAGNOSIS — Z5189 Encounter for other specified aftercare: Secondary | ICD-10-CM

## 2017-01-31 NOTE — ED Triage Notes (Signed)
Patient reports having sutures placed x1 week ago to left head and here to have them removed today.

## 2017-01-31 NOTE — Discharge Instructions (Signed)
1. Medications: usual home medications 2. Treatment: rest, drink plenty of fluids, keep wound clean with warm soap and water 3. Follow Up: Please followup with your primary doctor in as needed for discussion of your diagnoses and further evaluation after today's visit; if you do not have a primary care doctor use the resource guide provided to find one; Please return to the ER for this, swelling, drainage or other signs of infection

## 2017-01-31 NOTE — ED Provider Notes (Signed)
[facility]-EMERGENCY DEPT Provider Note   CSN: [id number] Arrival date & time: 01/31/17  1250   By signing my name below, I, Clarisse Gouge, attest that this documentation has been prepared under the direction and in the presence of [employer], PA-C. Electronically Signed: Clarisse Gouge, Scribe. 01/31/17. 1:28 PM.   History   Chief Complaint Chief Complaint  Patient presents with  . Suture / Staple Removal   The history is provided by the patient and medical records. No language interpreter was used.    HPI Comments: Phillip Mclaughlin is a 26 y.o. male with Hx of Hydrocephalus who presents to the Emergency Department for a suture removal. Pt reportedly had 3 sutures placed on his left eye on 01/22/2017 by Roxy Horseman, PA-C in [facility] ED. He states he unsuccessfully attempted to remove the stiches at home and he was referred to [facility] ED for removal of the sutures at other undisclosed medical practices where he attempted to pursue treatment.  Pt denies fever, chills, redness, purulent drainage.  Pt also expresses concern because he does not have anyone to F/U with for neurosurgery related to prior shunt placement to manage hydrocephalus.  Past Medical History:  Diagnosis Date  . Hydrocephalus 2004    There are no active problems to display for this patient.   Past Surgical History:  Procedure Laterality Date  . hydrocephalus surgery         Home Medications    Prior to Admission medications   Not on File    Family History No family history on file.  Social History Social History  Substance Use Topics  . Smoking status: Never Smoker  . Smokeless tobacco: Not on file  . Alcohol use No     Allergies   Patient has no known allergies.   Review of Systems Review of Systems  Skin: Positive for wound.  All other systems reviewed and are negative.    Physical Exam Updated Vital Signs BP 107/71   Pulse 62   Temp 98.8 F (37.1 C) (Oral)   Resp 16   SpO2 99%    Physical Exam  Constitutional: He appears well-developed and well-nourished. No distress.  HENT:  Head: Normocephalic and atraumatic.  Eyes: Conjunctivae are normal.  Neck: Normal range of motion.  Cardiovascular: Normal rate and intact distal pulses.   Capillary refill < 3 sec  Pulmonary/Chest: Effort normal.  Musculoskeletal: Normal range of motion.  Neurological: He is alert.  Skin: Skin is warm and dry.  Wound well approximated and healing well Left eyebrow 3 sutures in place No erythema, induration or purulent drainage  Psychiatric: He has a normal mood and affect.     ED Treatments / Results  DIAGNOSTIC STUDIES: Oxygen Saturation is 99% on room, normal by my interpretation.    COORDINATION OF CARE: 1:24 PM Discussed treatment plan with pt at bedside and pt agreed to plan. Pt prepared for discharge. Pt advised of symptomatic and cosmetic care at home. Will refer to neurosurgery.   Procedures .Suture Removal Date/Time: 01/31/2017 1:30 PM Performed by: Mancel Bale Authorized by: Mancel Bale   Consent:    Consent obtained:  Verbal   Consent given by:  Patient   Risks discussed:  Pain and wound separation Location:    Location:  Head/neck   Head/neck location:  Eyebrow   Eyebrow location:  L eyebrow Procedure details:    Wound appearance:  No signs of infection and good wound healing   Number of sutures removed:  3 Post-procedure  details:    Post-removal:  No dressing applied   Patient tolerance of procedure:  Tolerated well, no immediate complications   (including critical care time)  Medications Ordered in ED Medications - No data to display   Initial Impression / Assessment and Plan / ED Course  I have reviewed the triage vital signs and the nursing notes.  Pertinent labs & imaging results that were available during my care of the patient were reviewed by me and considered in my medical decision making (see chart for details).     Staple  removal   Pt to ER for staple/suture removal and wound check as above. Procedure tolerated well. Vitals normal, no signs of infection. Scar minimization & return precautions given at dc.   Final Clinical Impressions(s) / ED Diagnoses   Final diagnoses:  Visit for suture removal  Visit for wound check    New Prescriptions New Prescriptions   No medications on file   I personally performed the services described in this documentation, which was scribed in my presence. The recorded information has been reviewed and is accurate.    Dahlia ClientHannah Daxen Lanum, PA-C 01/31/17 1344    Mancel BaleElliott Wentz, MD 01/31/17 1725

## 2017-04-07 ENCOUNTER — Encounter (HOSPITAL_COMMUNITY): Payer: Self-pay

## 2017-04-07 ENCOUNTER — Emergency Department (HOSPITAL_COMMUNITY)
Admission: EM | Admit: 2017-04-07 | Discharge: 2017-04-08 | Disposition: A | Discharge status: Home / Self Care | Payer: 59 | Attending: Emergency Medicine | Admitting: Emergency Medicine

## 2017-04-07 DIAGNOSIS — L989 Disorder of the skin and subcutaneous tissue, unspecified: Secondary | ICD-10-CM

## 2017-04-07 DIAGNOSIS — R21 Rash and other nonspecific skin eruption: Secondary | ICD-10-CM | POA: Diagnosis not present

## 2017-04-07 NOTE — ED Provider Notes (Signed)
[facility]-EMERGENCY DEPT Provider Note   CSN: [id number] Arrival date & time: 04/07/17  2245  By signing my name below, I, Ny'Kea Lewis, attest that this documentation has been prepared under the direction and in the presence of Graciella FreerLindsey Rithwik Schmieg, PA-C. Electronically Signed: Karren CobbleNy'Kea Lewis, ED Scribe. 04/08/17. 12:52 AM.   History   Chief Complaint Chief Complaint  Patient presents with  . bump on ear   The history is provided by the patient. No language interpreter was used.    HPI Comments: Phillip Mclaughlin is a 26 y.o. male who presents to the Emergency Department complaining of Painful bump to the posterior right ear that began this morning. Patient states he first noted the bump this morning while he was at work. He reports associated pain feels like it is gotten worse since then. He has not taken any medications for the pain. He has not tried any other treatments. Patient states that he just came to make sure that it wasn't something serious or a insect bite. He denies any fevers, hearing loss, ear pain, ear drainage.   Past Medical History:  Diagnosis Date  . Hydrocephalus 2004    There are no active problems to display for this patient.   Past Surgical History:  Procedure Laterality Date  . hydrocephalus surgery         Home Medications    Prior to Admission medications   Not on File    Family History No family history on file.  Social History Social History  Substance Use Topics  . Smoking status: Never Smoker  . Smokeless tobacco: Not on file  . Alcohol use No     Allergies   Patient has no known allergies.   Review of Systems Review of Systems  Constitutional: Negative for fever.  HENT: Negative for ear discharge, ear pain and hearing loss.   Gastrointestinal: Negative for vomiting.  All other systems reviewed and are negative.    Physical Exam Updated Vital Signs BP 123/80 (BP Location: Left Arm)   Pulse (!) 46   Temp 98.5 F (36.9 C) (Oral)    Resp 16   Ht 5\' 7"  (1.702 m)   Wt 62.6 kg (138 lb)   SpO2 100%   BMI 21.61 kg/m   Physical Exam  Constitutional: He appears well-developed and well-nourished.  Sitting comfortably on examination table  HENT:  Head: Normocephalic and atraumatic.  Right Ear: Tympanic membrane normal. No mastoid tenderness. Tympanic membrane is not erythematous.  Left Ear: Tympanic membrane normal. No mastoid tenderness. Tympanic membrane is not erythematous.  Patient has a small 0.75 cm boil/pimple to the posterior auricle of the right ear. No surrounding warmth, erythema or edema. No evidence of insect bite or open wound. No mastoid tenderness bilaterally. No pain with movement of the tragus.  Eyes: Conjunctivae and EOM are normal. Right eye exhibits no discharge. Left eye exhibits no discharge. No scleral icterus.  Pulmonary/Chest: Effort normal.  Neurological: He is alert.  Skin: Skin is warm and dry.  Psychiatric: He has a normal mood and affect. His speech is normal and behavior is normal.  Nursing note and vitals reviewed.    ED Treatments / Results  DIAGNOSTIC STUDIES: Oxygen Saturation is RA% on 100, normal by my interpretation.   COORDINATION OF CARE: 12:52 AM-Discussed next steps with pt. Pt verbalized understanding and is agreeable with the plan.   Labs (all labs ordered are listed, but only abnormal results are displayed) Labs Reviewed - No data to display  EKG  EKG Interpretation None       Radiology No results found.  Procedures Procedures (including critical care time)  Medications Ordered in ED Medications - No data to display   Initial Impression / Assessment and Plan / ED Course  I have reviewed the triage vital signs and the nursing notes.  Pertinent labs & imaging results that were available during my care of the patient were reviewed by me and considered in my medical decision making (see chart for details).     26 year old male who presents with a small  bump to the posterior right ear with associated pain. No treatments tried prior to arrival. Physical exam findings appear to be suggestive of a small boil to the posterior auricle. History/physical exam are not concerning for an acute abscess that would need a drainage in the emergency department. History/physical exam are not concerning for mastoiditis or otitis externa. No indications for I&D in the department at this time. Instructed patient to apply warm compresses to help express drainage and take NSAIDs for symptomatic relief. Strict return precautions discussed. Patient stresses understanding and agreement to plan.  Final Clinical Impressions(s) / ED Diagnoses   Final diagnoses:  Bumps on skin    New Prescriptions There are no discharge medications for this patient.  I personally performed the services described in this documentation, which was scribed in my presence. The recorded information has been reviewed and is accurate.    Maxwell Caul, PA-C 04/08/17 1610    Alvira Monday, MD 04/10/17 [phone]

## 2017-04-07 NOTE — ED Notes (Signed)
Pt st's he noticed a small bump behind right ear this am.  St's through the day it has gotten bigger.  No drainage noted

## 2017-04-07 NOTE — ED Triage Notes (Signed)
Pt presents to the er after noticing a bump on the back of his right ear while at work, throughout the day it has gotten swollen and painful, he is concerned about being bit by a spider, denies any hearing loss or drainage

## 2017-04-08 NOTE — Discharge Instructions (Signed)
Use warm compresses to the area to help express drainage.  Take Tylenol or ibuprofen as needed for pain.  Follow-up with her primary care doctor in 24-48 hours.  If your symptoms worsen, he had worsening pain or no relief of pain after using warm compresses, he started running fever, satting hearing difficulties, worsening redness or swelling, return to the emergency department immediately.

## 2017-10-18 ENCOUNTER — Other Ambulatory Visit: Payer: Self-pay | Admitting: Family Medicine

## 2017-12-12 ENCOUNTER — Ambulatory Visit
Admission: RE | Admit: 2017-12-12 | Discharge: 2017-12-12 | Disposition: A | Discharge status: Home / Self Care | Payer: BLUE CROSS/BLUE SHIELD | Source: Ambulatory Visit | Attending: Family Medicine | Admitting: Family Medicine

## 2017-12-12 ENCOUNTER — Other Ambulatory Visit: Payer: Self-pay | Admitting: Family Medicine

## 2017-12-12 DIAGNOSIS — T1490XA Injury, unspecified, initial encounter: Secondary | ICD-10-CM

## 2018-01-04 ENCOUNTER — Other Ambulatory Visit: Payer: Self-pay

## 2018-01-04 ENCOUNTER — Ambulatory Visit (HOSPITAL_COMMUNITY)
Admission: EM | Admit: 2018-01-04 | Discharge: 2018-01-04 | Disposition: A | Discharge status: Home / Self Care | Payer: BLUE CROSS/BLUE SHIELD | Attending: Family Medicine | Admitting: Family Medicine

## 2018-01-04 ENCOUNTER — Ambulatory Visit (INDEPENDENT_AMBULATORY_CARE_PROVIDER_SITE_OTHER): Payer: BLUE CROSS/BLUE SHIELD

## 2018-01-04 ENCOUNTER — Encounter (HOSPITAL_COMMUNITY): Payer: Self-pay | Admitting: *Deleted

## 2018-01-04 DIAGNOSIS — M79674 Pain in right toe(s): Secondary | ICD-10-CM | POA: Diagnosis not present

## 2018-01-04 NOTE — Discharge Instructions (Addendum)
Ice/cold pack over area for 10-15 min twice daily.  OK to take Tylenol 1000 mg (2 extra strength tabs) or 975 mg (3 regular strength tabs) every 6 hours as needed.  Ibuprofen 400-600 mg (2-3 over the counter strength tabs) every 6 hours as needed for pain.  Take some time off and listen to your body.   This does not appear to be a fungus or any sort of infection.

## 2018-01-04 NOTE — ED Provider Notes (Signed)
  [facility]    CSN: [id number] Arrival date & time: 01/04/18  1746    Musculoskeletal Exam  Patient: Phillip Mclaughlin DOB: 09/26/1991  DOS: 01/04/2018  SUBJECTIVE:  Chief Complaint:   Chief Complaint  Patient presents with  . Foot Pain    Phillip Hightndrew Saylor is a 27 y.o.  male for evaluation and treatment of R great to pain.   Onset:  1 day ago.  Had finished a 7 mi run, runs frequently.  Location: R great toe under nail Character:  aching  Progression of issue:  is unchanged Associated symptoms: Bruising, nail changes Treatment: to date has been none.   Neurovascular symptoms: no  ROS: Musculoskeletal/Extremities: +R great toe pain  Past Medical History:  Diagnosis Date  . Hydrocephalus 2004    Objective: VITAL SIGNS: BP 126/85   Pulse (!) 55   Temp 98.1 F (36.7 C) (Oral)   Resp 16   SpO2 100%  Constitutional: Well formed, well developed. No acute distress. Cardiovascular: Brisk cap refill Thorax & Lungs: No accessory muscle use Musculoskeletal: Great toe.   Tenderness to palpation: yes Deformity: no Ecchymosis: yes- subungual Neurologic: Normal sensory function. Psychiatric: Normal mood. Age appropriate judgment and insight. Alert & oriented x 3.    Assessment:  Pain of toe of right foot  Plan: XR neg.  Ice, ibuprofen, Tylenol. Likely due to repetitive trauma. Make sure shoes are well fitted, cut back on mileage and take a break.  F/u w pcp prn. The patient voiced understanding and agreement to the plan.     Sharlene DoryWendling, Rikia Sukhu Paul, [place] 01/04/18 2239

## 2018-01-04 NOTE — ED Triage Notes (Signed)
Reports running 7 miles today without any problems.  After running noticed pain to right great toe.  Throughout the day pain has increased, redness has started and now has discoloration of nailbed.

## 2018-01-11 ENCOUNTER — Emergency Department (HOSPITAL_COMMUNITY)
Admission: EM | Admit: 2018-01-11 | Discharge: 2018-01-11 | Disposition: A | Discharge status: Home / Self Care | Payer: BLUE CROSS/BLUE SHIELD | Attending: Emergency Medicine | Admitting: Emergency Medicine

## 2018-01-11 ENCOUNTER — Encounter (HOSPITAL_COMMUNITY): Payer: Self-pay

## 2018-01-11 DIAGNOSIS — Z79899 Other long term (current) drug therapy: Secondary | ICD-10-CM | POA: Insufficient documentation

## 2018-01-11 DIAGNOSIS — K529 Noninfective gastroenteritis and colitis, unspecified: Secondary | ICD-10-CM | POA: Diagnosis not present

## 2018-01-11 DIAGNOSIS — R112 Nausea with vomiting, unspecified: Secondary | ICD-10-CM | POA: Diagnosis present

## 2018-01-11 LAB — CBC
HEMATOCRIT: 41.3 % (ref 39.0–52.0)
HEMOGLOBIN: 14.1 g/dL (ref 13.0–17.0)
MCH: 31.1 pg (ref 26.0–34.0)
MCHC: 34.1 g/dL (ref 30.0–36.0)
MCV: 91.2 fL (ref 78.0–100.0)
Platelets: 210 10*3/uL (ref 150–400)
RBC: 4.53 MIL/uL (ref 4.22–5.81)
RDW: 13.2 % (ref 11.5–15.5)
WBC: 11.4 10*3/uL — AB (ref 4.0–10.5)

## 2018-01-11 LAB — BASIC METABOLIC PANEL
ANION GAP: 6 (ref 5–15)
BUN: 18 mg/dL (ref 6–20)
CALCIUM: 8.6 mg/dL — AB (ref 8.9–10.3)
CO2: 26 mmol/L (ref 22–32)
Chloride: 108 mmol/L (ref 101–111)
Creatinine, Ser: 0.99 mg/dL (ref 0.61–1.24)
GFR calc non Af Amer: >60 mL/min (ref >60)
Glucose, Bld: 113 mg/dL — ABNORMAL HIGH (ref 65–99)
POTASSIUM: 4.2 mmol/L (ref 3.5–5.1)
Sodium: 140 mmol/L (ref 135–145)

## 2018-01-11 MED ORDER — SODIUM CHLORIDE 0.9 % IV BOLUS (SEPSIS)
2000.0000 mL | Freq: Once | INTRAVENOUS | Status: AC
  Administered 2018-01-11: 2000 mL via INTRAVENOUS

## 2018-01-11 MED ORDER — ONDANSETRON HCL 4 MG/2ML IJ SOLN
4.0000 mg | Freq: Once | INTRAMUSCULAR | Status: AC
  Administered 2018-01-11: 4 mg via INTRAVENOUS
  Filled 2018-01-11: qty 2

## 2018-01-11 MED ORDER — LOPERAMIDE HCL 2 MG PO CAPS
4.0000 mg | ORAL_CAPSULE | Freq: Once | ORAL | Status: AC
  Administered 2018-01-11: 4 mg via ORAL
  Filled 2018-01-11: qty 2

## 2018-01-11 MED ORDER — LOPERAMIDE HCL 2 MG PO CAPS: 2.0000 mg | ORAL_CAPSULE | Freq: Four times a day (QID) | ORAL | 0 refills | Status: AC | PRN

## 2018-01-11 MED ORDER — ONDANSETRON 8 MG PO TBDP: 8.0000 mg | ORAL_TABLET | Freq: Three times a day (TID) | ORAL | 0 refills | Status: AC | PRN

## 2018-01-11 NOTE — ED Provider Notes (Signed)
[facility]-EMERGENCY DEPT Provider Note   CSN: [id number] Arrival date & time: 01/11/18  [id number]     History   Chief Complaint Chief Complaint  Patient presents with  . Emesis  . Diarrhea    HPI Phillip Mclaughlin is a 27 y.o. male.  HPI Pt presented with nausea and vomiting that started last night at 9pm.  He is not sure if he ate something bad but it started after eating something at work.  He is not aware if anyone else is sick.  He has vomited numerous times and has had diarrhea.  He has not been able to keep anything down.  No recent travel.  No recent abx.  Pt does work at [employer].  Past Medical History:  Diagnosis Date  . Hydrocephalus 2004    There are no active problems to display for this patient.   Past Surgical History:  Procedure Laterality Date  . hydrocephalus surgery         Home Medications    Prior to Admission medications   Medication Sig Start Date End Date Taking? Authorizing Provider  cholecalciferol (VITAMIN D) 1000 units tablet Take 5,000 Units by mouth daily.   Yes [provider]  FLUoxetine (PROZAC) 20 MG capsule Take 10 mg by mouth daily. 11/24/17  Yes [provider]  levocetirizine (XYZAL) 5 MG tablet Take 5 mg by mouth daily.   Yes [provider]  loperamide (IMODIUM) 2 MG capsule Take 1 capsule (2 mg total) by mouth 4 (four) times daily as needed for diarrhea or loose stools. 01/11/18   Linwood DibblesKnapp, Arvin Abello, MD  ondansetron (ZOFRAN ODT) 8 MG disintegrating tablet Take 1 tablet (8 mg total) by mouth every 8 (eight) hours as needed for nausea or vomiting. 01/11/18   Linwood DibblesKnapp, Anmol Fleck, MD    Family History History reviewed. No pertinent family history.  Social History Social History   Tobacco Use  . Smoking status: Never Smoker  . Smokeless tobacco: Never Used  Substance Use Topics  . Alcohol use: No  . Drug use: No     Allergies   Patient has no known allergies.   Review of  Systems Review of Systems  All other systems reviewed and are negative.    Physical Exam Updated Vital Signs BP 117/72 (BP Location: Right Arm)   Pulse 67   Temp 98.2 F (36.8 C) (Oral)   Resp 18   SpO2 100%   Physical Exam  Constitutional: He appears well-developed and well-nourished. No distress.  HENT:  Head: Normocephalic and atraumatic.  Right Ear: External ear normal.  Left Ear: External ear normal.  Mm dry, eyes sunken  Eyes: Conjunctivae are normal. Right eye exhibits no discharge. Left eye exhibits no discharge. No scleral icterus.  Neck: Neck supple. No tracheal deviation present.  Cardiovascular: Normal rate, regular rhythm and intact distal pulses.  Pulmonary/Chest: Effort normal and breath sounds normal. No stridor. No respiratory distress. He has no wheezes. He has no rales.  Abdominal: Soft. Bowel sounds are normal. He exhibits no distension. There is no tenderness. There is no rebound and no guarding.  Musculoskeletal: He exhibits no edema or tenderness.  Neurological: He is alert. He has normal strength. No cranial nerve deficit (no facial droop, extraocular movements intact, no slurred speech) or sensory deficit. He exhibits normal muscle tone. He displays no seizure activity. Coordination normal.  Skin: Skin is warm and dry. No rash noted.  Psychiatric: He has a normal mood  and affect.  Nursing note and vitals reviewed.    ED Treatments / Results  Labs (all labs ordered are listed, but only abnormal results are displayed) Labs Reviewed  CBC - Abnormal; Notable for the following components:      Result Value   WBC 11.4 (*)    All other components within normal limits  BASIC METABOLIC PANEL - Abnormal; Notable for the following components:   Glucose, Bld 113 (*)    Calcium 8.6 (*)    All other components within normal limits      Procedures Procedures (including critical care time)  Medications Ordered in ED Medications  sodium chloride 0.9 %  bolus 2,000 mL (2,000 mLs Intravenous New Bag/Given 01/11/18 0506)  ondansetron (ZOFRAN) injection 4 mg (4 mg Intravenous Given 01/11/18 0506)  loperamide (IMODIUM) capsule 4 mg (4 mg Oral Given 01/11/18 0459)     Initial Impression / Assessment and Plan / ED Course  I have reviewed the triage vital signs and the nursing notes.  Pertinent labs & imaging results that were available during my care of the patient were reviewed by me and considered in my medical decision making (see chart for details).   Patient presents emergency room with complaints of vomiting and diarrhea.  No focal tenderness on exam.  Laboratory tests are notable for slight leukocytosis but no significant electrolyte abnormalities.  Suspect the patient's symptoms are related to a viral gastroenteritis.  Patient improved after IV fluids.  Plan on discharge home with antiemetics and antidiarrheal agents.  Final Clinical Impressions(s) / ED Diagnoses   Final diagnoses:  Gastroenteritis    ED Discharge Orders        Ordered    ondansetron (ZOFRAN ODT) 8 MG disintegrating tablet  Every 8 hours PRN     01/11/18 0659    loperamide (IMODIUM) 2 MG capsule  4 times daily PRN     01/11/18 1610       Linwood Dibbles, MD 01/11/18 [id number]

## 2018-01-11 NOTE — Discharge Instructions (Signed)
Take the medications as prescribed, drink plenty of fluids, return as needed for worsening symptoms

## 2018-01-11 NOTE — ED Triage Notes (Signed)
Pt states that he ate about 7pm and around 930pm he started to vomit and have diarrhea, he states it's been continuous since then

## 2018-03-26 ENCOUNTER — Other Ambulatory Visit: Payer: Self-pay | Admitting: Neurosurgery

## 2018-03-26 DIAGNOSIS — G911 Obstructive hydrocephalus: Principal | ICD-10-CM

## 2018-03-26 DIAGNOSIS — G918 Other hydrocephalus: Secondary | ICD-10-CM

## 2018-04-24 ENCOUNTER — Other Ambulatory Visit: Payer: BLUE CROSS/BLUE SHIELD

## 2018-05-08 ENCOUNTER — Other Ambulatory Visit: Payer: BLUE CROSS/BLUE SHIELD

## 2018-05-22 IMAGING — DX DG TOE GREAT 2+V*R*
3 series · 3 of 3 positions shown · non-contrast
Comparison: None.

CLINICAL DATA: Patient states that he started experiencing pain in
the right great toe this morning at running, No injury or fall.
Discoloration to the toenail and swelling.

EXAM:
RIGHT GREAT TOE

[toe ap]
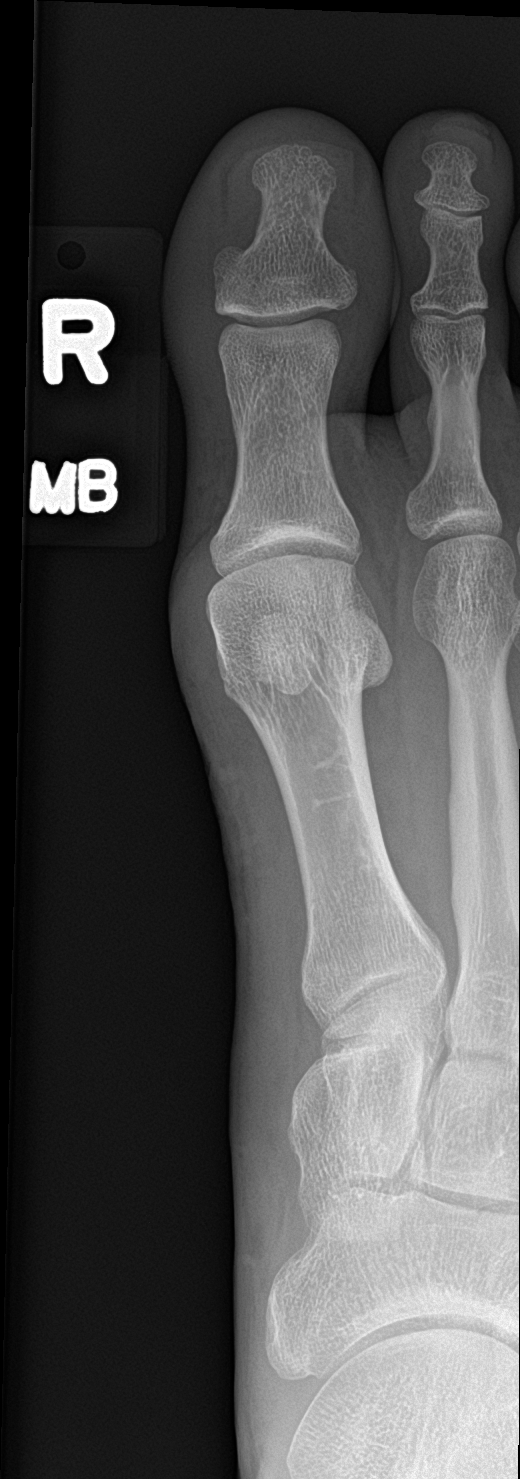

[toe obl]
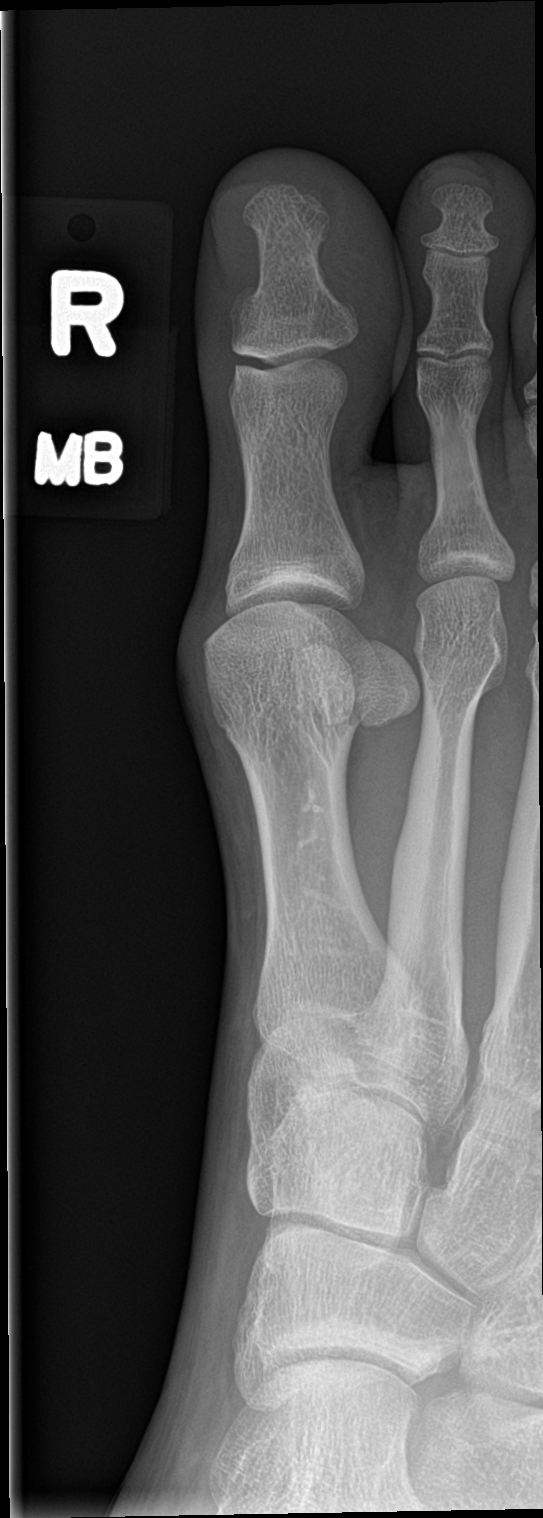

[toe lat]
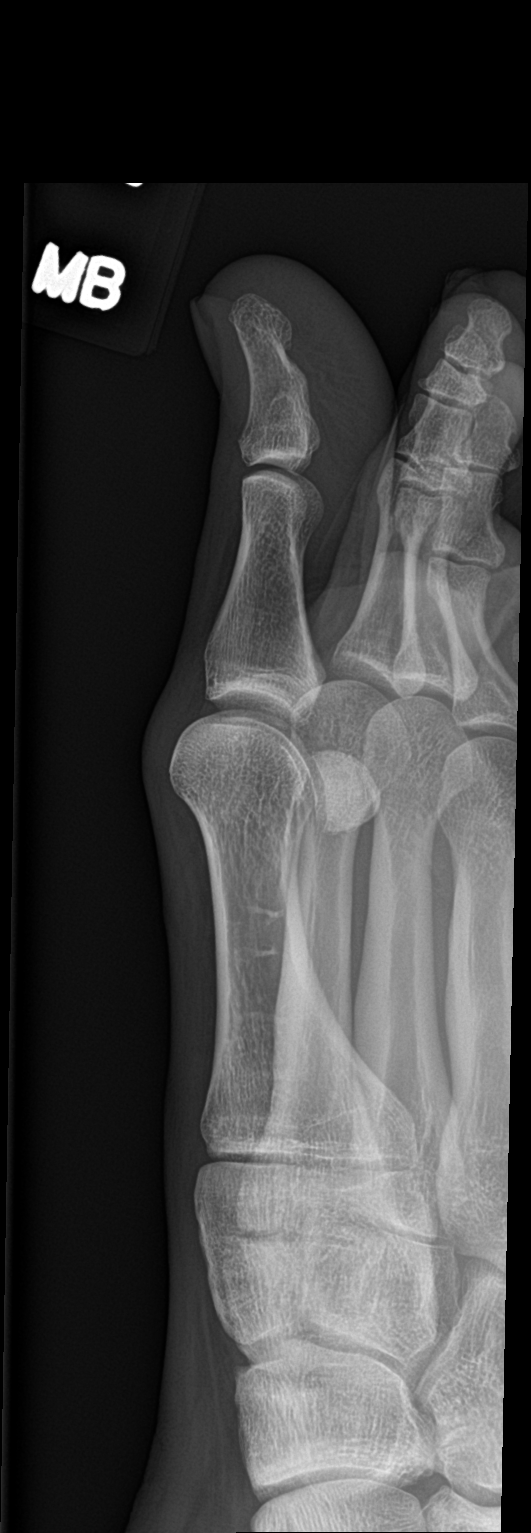

[3 of 3 positions shown; findings below may reference images not displayed]

FINDINGS: There is no evidence of fracture or dislocation. There is no
evidence of arthropathy or other focal bone abnormality. Soft
tissues are unremarkable.
IMPRESSION: Negative.
